# Patient Record
Sex: Male | Born: 1985 | Race: White | Hispanic: No | Marital: Single | Smoking: Current every day smoker
Health system: Southern US, Community
[De-identification: ages and names within clinical notes are randomized; demographics above are authoritative.]

---

## 2005-06-07 ENCOUNTER — Emergency Department: Payer: Self-pay | Admitting: Emergency Medicine

## 2006-02-01 ENCOUNTER — Emergency Department: Payer: Self-pay | Admitting: Emergency Medicine

## 2011-04-30 ENCOUNTER — Emergency Department: Payer: Self-pay | Admitting: Internal Medicine

## 2011-07-13 ENCOUNTER — Emergency Department: Payer: Self-pay | Admitting: Emergency Medicine

## 2011-10-16 ENCOUNTER — Emergency Department: Payer: Self-pay | Admitting: Emergency Medicine
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
## 2012-04-25 IMAGING — CR DG CHEST 1V PORT
1 series · 1 of 1 positions shown · non-contrast
Comparison: none

REASON FOR EXAM: dsypnea
COMMENTS:

[portable]
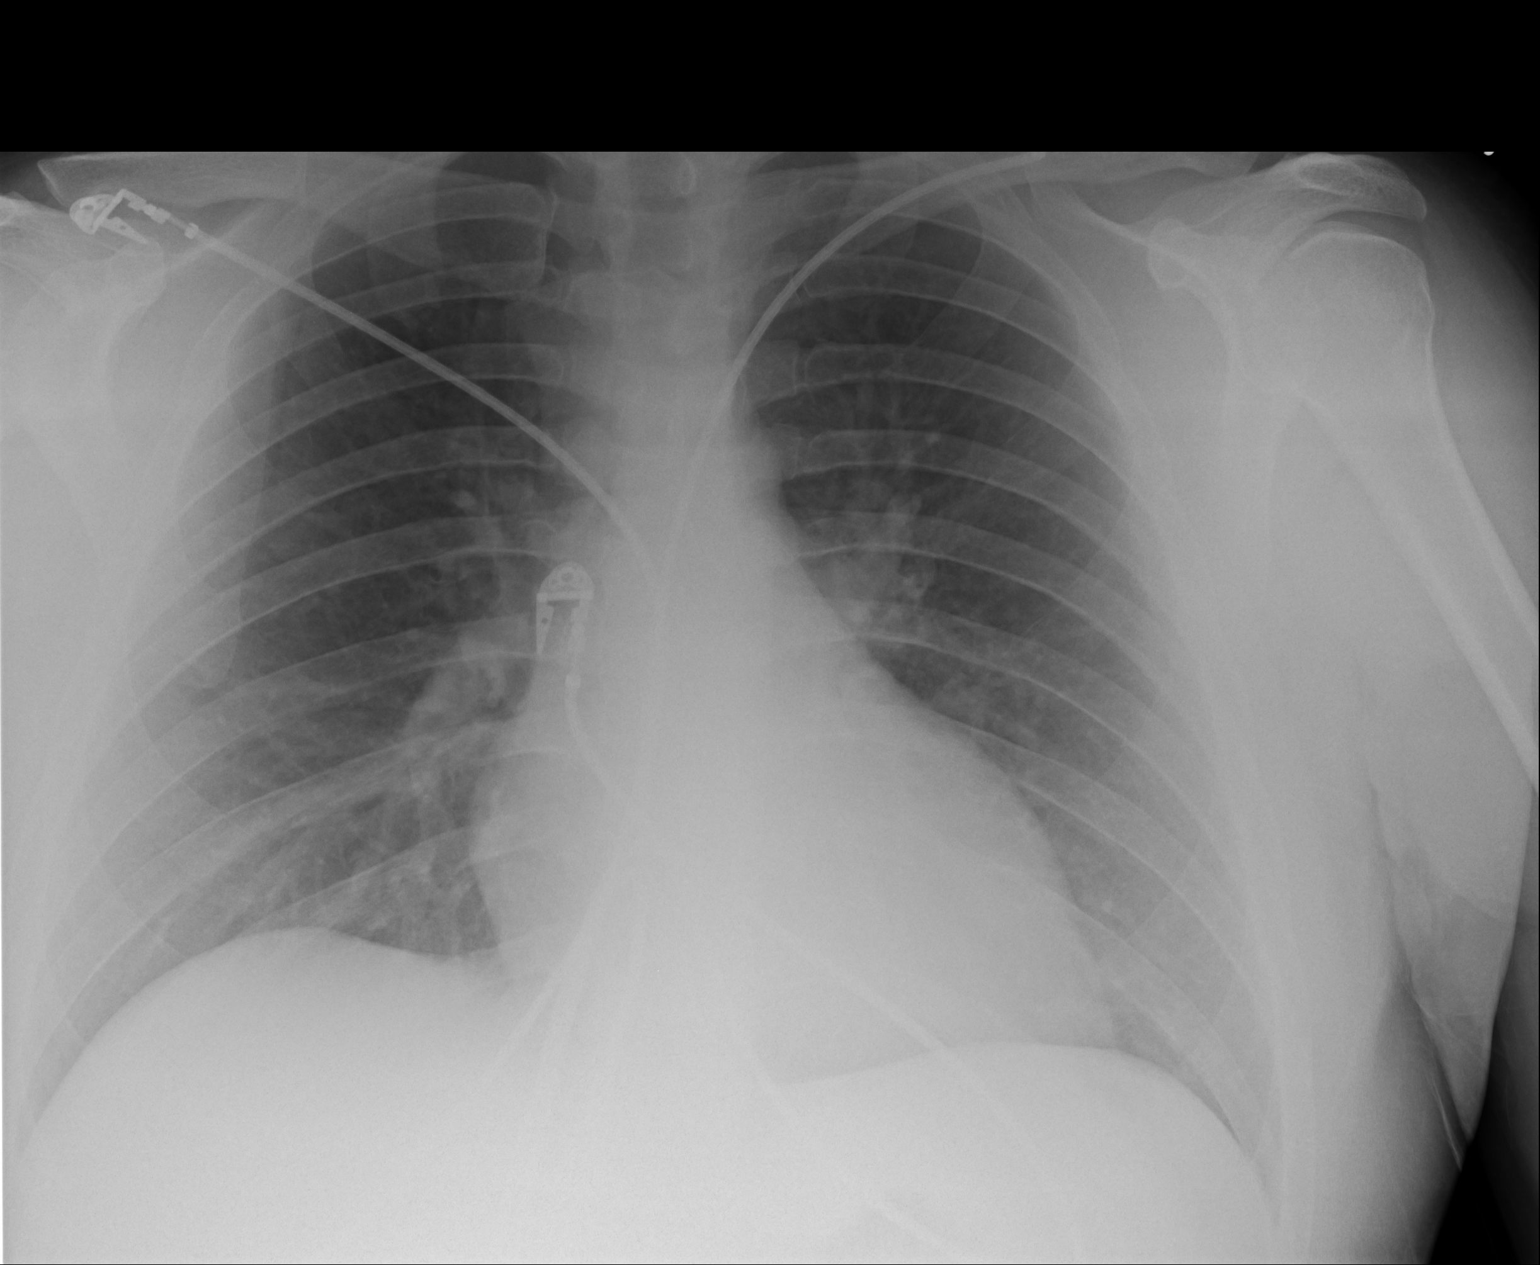

[1 of 1 positions shown; findings below may reference images not displayed]

PROCEDURE:     DXR - DXR PORTABLE CHEST SINGLE VIEW  - October 16, 2011 [DATE]

RESULT:     Comparison is made to the prior exam of 04/30/2011. No pneumonia,
pneumothorax or pleural effusion is seen. Heart size is normal. No acute
bony abnormalities are identified. The subpleural nodules on the right
previously observed at CT are not evident on routine radiography. Monitoring
electrodes are present.
IMPRESSION: 1.     No acute changes are identified.

## 2019-11-14 ENCOUNTER — Emergency Department
Admission: EM | Admit: 2019-11-14 | Discharge: 2019-11-14 | Disposition: A | Discharge status: Home / Self Care | Payer: Self-pay | Attending: Student | Admitting: Student

## 2019-11-14 ENCOUNTER — Other Ambulatory Visit: Payer: Self-pay

## 2019-11-14 ENCOUNTER — Encounter: Payer: Self-pay | Admitting: Emergency Medicine

## 2019-11-14 DIAGNOSIS — Y939 Activity, unspecified: Secondary | ICD-10-CM | POA: Insufficient documentation

## 2019-11-14 DIAGNOSIS — Y929 Unspecified place or not applicable: Secondary | ICD-10-CM | POA: Insufficient documentation

## 2019-11-14 DIAGNOSIS — F17210 Nicotine dependence, cigarettes, uncomplicated: Secondary | ICD-10-CM | POA: Insufficient documentation

## 2019-11-14 DIAGNOSIS — Y288XXA Contact with other sharp object, undetermined intent, initial encounter: Secondary | ICD-10-CM | POA: Insufficient documentation

## 2019-11-14 DIAGNOSIS — S81811A Laceration without foreign body, right lower leg, initial encounter: Secondary | ICD-10-CM | POA: Insufficient documentation

## 2019-11-14 DIAGNOSIS — Y999 Unspecified external cause status: Secondary | ICD-10-CM | POA: Insufficient documentation

## 2019-11-14 DIAGNOSIS — F1210 Cannabis abuse, uncomplicated: Secondary | ICD-10-CM | POA: Insufficient documentation

## 2019-11-14 MED ORDER — BACITRACIN-NEOMYCIN-POLYMYXIN 400-5-5000 EX OINT
TOPICAL_OINTMENT | Freq: Once | CUTANEOUS | Status: AC
  Administered 2019-11-14: 1 via TOPICAL
  Filled 2019-11-14: qty 1

## 2019-11-14 MED ORDER — HYDROCODONE-ACETAMINOPHEN 5-325 MG PO TABS: 1.0000 | ORAL_TABLET | Freq: Four times a day (QID) | ORAL | 0 refills | Status: AC | PRN

## 2019-11-14 MED ORDER — CEPHALEXIN 500 MG PO CAPS: 500.0000 mg | ORAL_CAPSULE | Freq: Two times a day (BID) | ORAL | 0 refills | Status: AC
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
## 2019-11-14 NOTE — Discharge Instructions (Signed)
Rest and elevate your leg as much as possible over the next few days. Use the crutches when you need to get up an move around. Dress the wound 2 times per day and apply antibiotic antibiotic ointment. Take the antibiotic until finished and the pain medication only if needed. Return to the ER for any sign or concern of infection. Sutures need to be removed in about 12 days.

## 2022-07-05 ENCOUNTER — Encounter: Payer: Self-pay | Admitting: Emergency Medicine

## 2022-07-05 ENCOUNTER — Other Ambulatory Visit: Payer: Self-pay

## 2022-07-05 ENCOUNTER — Emergency Department: Payer: Self-pay

## 2022-07-05 ENCOUNTER — Emergency Department
Admission: EM | Admit: 2022-07-05 | Discharge: 2022-07-05 | Disposition: A | Discharge status: Home / Self Care | Payer: Self-pay | Attending: Emergency Medicine | Admitting: Emergency Medicine

## 2022-07-05 DIAGNOSIS — L03213 Periorbital cellulitis: Secondary | ICD-10-CM | POA: Insufficient documentation

## 2022-07-05 LAB — CBC WITH DIFFERENTIAL/PLATELET
Abs Immature Granulocytes: 0.08 10*3/uL — ABNORMAL HIGH (ref 0.00–0.07)
Basophils Absolute: 0.1 10*3/uL (ref 0.0–0.1)
Basophils Relative: 1 %
Eosinophils Absolute: 0.2 10*3/uL (ref 0.0–0.5)
Eosinophils Relative: 3 %
HCT: 41.2 % (ref 39.0–52.0)
Hemoglobin: 13.4 g/dL (ref 13.0–17.0)
Immature Granulocytes: 1 %
Lymphocytes Relative: 36 %
Lymphs Abs: 3.3 10*3/uL (ref 0.7–4.0)
MCH: 29.3 pg (ref 26.0–34.0)
MCHC: 32.5 g/dL (ref 30.0–36.0)
MCV: 90 fL (ref 80.0–100.0)
Monocytes Absolute: 0.6 10*3/uL (ref 0.1–1.0)
Monocytes Relative: 7 %
Neutro Abs: 4.7 10*3/uL (ref 1.7–7.7)
Neutrophils Relative %: 52 %
Platelets: 352 10*3/uL (ref 150–400)
RBC: 4.58 MIL/uL (ref 4.22–5.81)
RDW: 13.2 % (ref 11.5–15.5)
WBC: 9 10*3/uL (ref 4.0–10.5)
nRBC: 0 % (ref 0.0–0.2)

## 2022-07-05 LAB — COMPREHENSIVE METABOLIC PANEL
ALT: 13 U/L (ref 0–44)
AST: 14 U/L — ABNORMAL LOW (ref 15–41)
Albumin: 4 g/dL (ref 3.5–5.0)
Alkaline Phosphatase: 75 U/L (ref 38–126)
Anion gap: 6 (ref 5–15)
BUN: 11 mg/dL (ref 6–20)
CO2: 24 mmol/L (ref 22–32)
Calcium: 8.8 mg/dL — ABNORMAL LOW (ref 8.9–10.3)
Chloride: 108 mmol/L (ref 98–111)
Creatinine, Ser: 0.69 mg/dL (ref 0.61–1.24)
GFR, Estimated: >60 mL/min (ref >60)
Glucose, Bld: 106 mg/dL — ABNORMAL HIGH (ref 70–99)
Potassium: 3.6 mmol/L (ref 3.5–5.1)
Sodium: 138 mmol/L (ref 135–145)
Total Bilirubin: 0.6 mg/dL (ref 0.3–1.2)
Total Protein: 7.2 g/dL (ref 6.5–8.1)

## 2022-07-05 MED ORDER — AMOXICILLIN-POT CLAVULANATE 875-125 MG PO TABS: 1.0000 | ORAL_TABLET | Freq: Two times a day (BID) | ORAL | 0 refills | Status: DC

## 2022-07-05 MED ORDER — SODIUM CHLORIDE 0.9 % IV SOLN
1.0000 g | Freq: Once | INTRAVENOUS | Status: AC
  Administered 2022-07-05: 1 g via INTRAVENOUS
  Filled 2022-07-05: qty 10

## 2022-07-05 MED ORDER — SULFACETAMIDE SODIUM 10 % OP SOLN: 2.0000 [drp] | Freq: Four times a day (QID) | OPHTHALMIC | 0 refills | Status: AC

## 2022-07-05 MED ORDER — IOHEXOL 300 MG/ML  SOLN
75.0000 mL | Freq: Once | INTRAMUSCULAR | Status: AC | PRN
  Administered 2022-07-05: 75 mL via INTRAVENOUS

## 2022-07-05 NOTE — ED Provider Notes (Signed)
[facility] Provider Note    Event Date/Time   First MD Initiated Contact with Patient 07/05/22 [phone]     (approximate)   History   Eye Pain   HPI  Antonio Arias is a 36 y.o. male with no significant past medical history presents emergency department with left eye swelling.  Patient states he thought was a stye earlier today but now it is gotten so big and swollen he cannot open his eye.  Has a lot of tearing from the eye.  No fever or chills.  Does have pain when trying to open the eye but no pain with movement of the eyeball      Physical Exam   Triage Vital Signs: ED Triage Vitals  Enc Vitals Group     BP 07/05/22 0924 (!) 158/83     Pulse Rate 07/05/22 0924 73     Resp 07/05/22 0924 20     Temp 07/05/22 0924 97.8 F (36.6 C)     Temp Source 07/05/22 0924 Oral     SpO2 07/05/22 0924 99 %     Weight 07/05/22 0923 240 lb (108.9 kg)     Height 07/05/22 0923 5\' 9"  (1.753 m)     Head Circumference --      Peak Flow --      Pain Score 07/05/22 0923 7     Pain Loc --      Pain Edu? --      Excl. in [place]? --     Most recent vital signs: Vitals:   07/05/22 0924 07/05/22 1241  BP: (!) 158/83 (!) 148/80  Pulse: 73 75  Resp: 20 15  Temp: 97.8 F (36.6 C)   SpO2: 99% 99%     General: Awake, no distress.   CV:  Good peripheral perfusion. regular rate and  rhythm Resp:  Normal effort.  Abd:  No distention.   Other:  Left eye is grossly swollen and red in the periorbital area, EOMI intact, there is crusting and drainage noted   ED Results / Procedures / Treatments   Labs (all labs ordered are listed, but only abnormal results are displayed) Labs Reviewed  CBC WITH DIFFERENTIAL/PLATELET - Abnormal; Notable for the following components:      Result Value   Abs Immature Granulocytes 0.08 (*)    All other components within normal limits  COMPREHENSIVE METABOLIC PANEL - Abnormal; Notable for the following components:   Glucose, Bld 106 (*)     Calcium 8.8 (*)    AST 14 (*)    All other components within normal limits     EKG     RADIOLOGY CT of the orbits with contrast for orbital cellulitis    PROCEDURES:   Procedures   MEDICATIONS ORDERED IN ED: Medications  iohexol (OMNIPAQUE) 300 MG/ML solution 75 mL (75 mLs Intravenous Contrast Given 07/05/22 1115)  cefTRIAXone (ROCEPHIN) 1 g in sodium chloride 0.9 % 100 mL IVPB (0 g Intravenous Stopped 07/05/22 1240)     IMPRESSION / MDM / ASSESSMENT AND PLAN / ED COURSE  I reviewed the triage vital signs and the nursing notes.                              Differential diagnosis includes, but is not limited to, orbital cellulitis, preseptal cellulitis, conjunctivitis  Patient's presentation is most consistent with acute presentation with potential threat to life or bodily function.  Patient's labs are reassuring  CT of the orbits with contrast was independently reviewed and interpreted by me as having preseptal cellulitis.  I did explain these findings to the patient.  We gave him Rocephin 1 g IV while here in the ED.  Be placed on Augmentin 875 twice daily for 7 days, will also be given antibiotic eyedrops for suspected conjunctivitis.  Patient is in agreement treatment plan.  Is given a work note and discharged stable condition.  Strict instructions to return if worsening.      FINAL CLINICAL IMPRESSION(S) / ED DIAGNOSES   Final diagnoses:  Preseptal cellulitis of left eye     Rx / DC Orders   ED Discharge Orders          Ordered    amoxicillin-clavulanate (AUGMENTIN) 875-125 MG tablet  2 times daily        07/05/22 1230    sulfacetamide (BLEPH-10) 10 % ophthalmic solution  4 times daily        07/05/22 1230             Note:  This document was prepared using Dragon voice recognition software and may include unintentional dictation errors.    Faythe Ghee, PA-C 07/05/22 1503    Concha Se, MD 07/06/22 2035

## 2022-07-05 NOTE — Discharge Instructions (Signed)
Follow up with your regular doctor if not improving in 3 days, return if worsening, use the medication as prescribed, apply a warm compress to the eye

## 2022-07-05 NOTE — ED Triage Notes (Signed)
Pt reports thought he was getting a stye in his left eye over the last few days but then this am woke up with his left eye swollen, crusted shut, burning and itchy.

## 2022-07-05 NOTE — ED Notes (Signed)
Dc ppw provided to pt. RX info and followup reviewed as applicable. Pt provides verbal consent for dc. Pt ambultory off unit to lobby. 

## 2022-08-06 ENCOUNTER — Encounter: Payer: Self-pay | Admitting: Emergency Medicine

## 2022-08-06 ENCOUNTER — Other Ambulatory Visit: Payer: Self-pay

## 2022-08-06 ENCOUNTER — Telehealth: Payer: Self-pay | Admitting: Emergency Medicine

## 2022-08-06 ENCOUNTER — Emergency Department
Admission: EM | Admit: 2022-08-06 | Discharge: 2022-08-06 | Disposition: A | Discharge status: Home / Self Care | Payer: Self-pay | Attending: Emergency Medicine | Admitting: Emergency Medicine

## 2022-08-06 DIAGNOSIS — H00014 Hordeolum externum left upper eyelid: Secondary | ICD-10-CM | POA: Insufficient documentation

## 2022-08-06 MED ORDER — SULFACETAMIDE SODIUM 10 % OP SOLN: 2.0000 [drp] | Freq: Four times a day (QID) | OPHTHALMIC | 0 refills | Status: DC

## 2022-08-06 MED ORDER — AMOXICILLIN-POT CLAVULANATE 875-125 MG PO TABS: 1.0000 | ORAL_TABLET | Freq: Two times a day (BID) | ORAL | 0 refills | Status: DC

## 2022-08-06 MED ORDER — SULFACETAMIDE SODIUM 10 % OP SOLN: 2.0000 [drp] | Freq: Four times a day (QID) | OPHTHALMIC | 0 refills | Status: AC

## 2022-08-06 MED ORDER — AMOXICILLIN 875 MG PO TABS: 875.0000 mg | ORAL_TABLET | Freq: Two times a day (BID) | ORAL | 0 refills | Status: DC

## 2022-08-06 MED ORDER — AMOXICILLIN-POT CLAVULANATE 875-125 MG PO TABS: 1.0000 | ORAL_TABLET | Freq: Two times a day (BID) | ORAL | 0 refills | Status: AC

## 2022-08-06 NOTE — ED Notes (Signed)
Right eye 20/100 Left eye 20/50 Bilateral 20/50

## 2022-08-06 NOTE — ED Provider Notes (Signed)
[facility] Provider Note    Event Date/Time   First MD Initiated Contact with Patient 08/06/22 1255     (approximate)   History   Eye Problem   HPI  Antonio Arias is a 36 y.o. male   presents to the ED with complaint of left upper eyelid swelling.  Patient was seen in the ED for similar symptoms to his left eye on 07/05/22.  He was seen at that time and prescribed antibiotics and an eyedrop and diagnosed with a preseptal cellulitis.  Patient states that that improved at that time however his upper eyelid has began swelling and has been using      Physical Exam   Triage Vital Signs: ED Triage Vitals  Enc Vitals Group     BP 08/06/22 1115 (!) 137/94     Pulse Rate 08/06/22 1115 (!) 102     Resp 08/06/22 1115 18     Temp 08/06/22 1115 98.4 F (36.9 C)     Temp Source 08/06/22 1115 Oral     SpO2 08/06/22 1115 95 %     Weight 08/06/22 1113 240 lb (108.9 kg)     Height 08/06/22 1113 5\' 9"  (1.753 m)     Head Circumference --      Peak Flow --      Pain Score 08/06/22 1113 0     Pain Loc --      Pain Edu? --      Excl. in [place]? --     Most recent vital signs: Vitals:   08/06/22 1115 08/06/22 1348  BP: (!) 137/94 130/88  Pulse: (!) 102 88  Resp: 18 18  Temp: 98.4 F (36.9 C)   SpO2: 95% 95%     General: Awake, no distress.  CV:  Good peripheral perfusion.  Resp:  Normal effort.  Abd:  No distention.  Other:  Left upper eyelid with soft tissue edema.  Lid was inverted and no drainage noted.  No foreign body.  Area is consistent with an early stye.  Conjunctive are clear bilaterally.   ED Results / Procedures / Treatments   Labs (all labs ordered are listed, but only abnormal results are displayed) Labs Reviewed - No data to display     PROCEDURES:  Critical Care performed:   Procedures   MEDICATIONS ORDERED IN ED: Medications - No data to display   IMPRESSION / MDM / [place] / ED COURSE  I reviewed the  triage vital signs and the nursing notes.   Differential diagnosis includes, but is not limited to, stye, chronic hordeolum, cellulitis.  36 year old male presents to the ED with left upper eyelid edema which is worrisome as he was seen on 07/05/2022 that he may be getting another cellulitis.  Patient is anxious because he is supposed to start new job.  He has been using warm moist compresses without complete relief.  Area is consistent with a stye.  Will prescribe Augmentin and Bleph-10 ophthalmic drops as he was given in August.  He is to continue with warm moist compresses.  He is to follow-up with [facility] if not improving.      Patient's presentation is most consistent with acute, uncomplicated illness.  FINAL CLINICAL IMPRESSION(S) / ED DIAGNOSES   Final diagnoses:  Hordeolum externum of left upper eyelid     Rx / DC Orders   ED Discharge Orders          Ordered  amoxicillin-clavulanate (AUGMENTIN) 875-125 MG tablet  2 times daily        08/06/22 1339    sulfacetamide (BLEPH-10) 10 % ophthalmic solution  4 times daily        08/06/22 1339             Note:  This document was prepared using Dragon voice recognition software and may include unintentional dictation errors.   Tommi Rumps, PA-C 08/06/22 1436    Sharman Cheek, MD 08/06/22 2003

## 2022-08-06 NOTE — Discharge Instructions (Signed)
We will make an appoint with Dr. Lazarus Salines who is on-call for [facility] if not improving.  Begin taking antibiotics as you did before and also the eyedrops.  The important thing is to use warm moist compresses to the area frequently.

## 2022-08-06 NOTE — ED Triage Notes (Signed)
Patient to ED for possible eye infection. Patient states he had same 2 weeks ago. Swelling noted to left eye. Patient states he has slight visual difficulties and irritation.

## 2022-08-06 NOTE — Telephone Encounter (Cosign Needed)
Rx to different pharmacy

## 2022-08-06 NOTE — ED Notes (Signed)
See triage note  Presents with some swelling and slight blurred vision to left eye  States he had similar episode about 2 weeks ago

## 2022-10-31 NOTE — Telephone Encounter (Cosign Needed)
See notes
